# Patient Record
Sex: Male | Born: 1995 | Hispanic: No | Marital: Single
Health system: Southern US, Community
[De-identification: ages and names within clinical notes are randomized; demographics above are authoritative.]

---
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
## 2019-04-01 ENCOUNTER — Other Ambulatory Visit: Payer: Self-pay

## 2019-04-01 ENCOUNTER — Emergency Department (HOSPITAL_COMMUNITY)
Admission: EM | Admit: 2019-04-01 | Discharge: 2019-04-01 | Disposition: A | Discharge status: Home / Self Care | Payer: No Typology Code available for payment source | Attending: Emergency Medicine | Admitting: Emergency Medicine

## 2019-04-01 ENCOUNTER — Emergency Department (HOSPITAL_COMMUNITY): Payer: No Typology Code available for payment source

## 2019-04-01 DIAGNOSIS — Y93I9 Activity, other involving external motion: Secondary | ICD-10-CM | POA: Insufficient documentation

## 2019-04-01 DIAGNOSIS — M546 Pain in thoracic spine: Secondary | ICD-10-CM | POA: Insufficient documentation

## 2019-04-01 DIAGNOSIS — Y999 Unspecified external cause status: Secondary | ICD-10-CM | POA: Insufficient documentation

## 2019-04-01 DIAGNOSIS — M25512 Pain in left shoulder: Secondary | ICD-10-CM | POA: Insufficient documentation

## 2019-04-01 DIAGNOSIS — Y92410 Unspecified street and highway as the place of occurrence of the external cause: Secondary | ICD-10-CM | POA: Diagnosis not present

## 2019-04-01 DIAGNOSIS — M542 Cervicalgia: Secondary | ICD-10-CM | POA: Diagnosis not present

## 2019-04-01 MED ORDER — ACETAMINOPHEN 325 MG PO TABS
650.0000 mg | ORAL_TABLET | Freq: Once | ORAL | Status: AC
  Administered 2019-04-01: 650 mg via ORAL
  Filled 2019-04-01: qty 2

## 2019-04-01 MED ORDER — CYCLOBENZAPRINE HCL 10 MG PO TABS: 10.0000 mg | ORAL_TABLET | Freq: Two times a day (BID) | ORAL | 0 refills | Status: AC | PRN
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
## 2019-04-01 NOTE — ED Notes (Signed)
Patient transported to X-ray 

## 2019-04-01 NOTE — ED Notes (Signed)
Patient returned from X-ray 
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
